# Patient Record
Sex: Male | Born: 1970 | Race: White | Hispanic: No | Marital: Married | Smoking: Never smoker
Health system: Southern US, Community
[De-identification: ages and names within clinical notes are randomized; demographics above are authoritative.]

## PROBLEM LIST (undated history)

## (undated) DIAGNOSIS — E785 Hyperlipidemia, unspecified: Secondary | ICD-10-CM

## (undated) DIAGNOSIS — I10 Essential (primary) hypertension: Secondary | ICD-10-CM

## (undated) DIAGNOSIS — E119 Type 2 diabetes mellitus without complications: Secondary | ICD-10-CM

## (undated) DIAGNOSIS — I2510 Atherosclerotic heart disease of native coronary artery without angina pectoris: Secondary | ICD-10-CM

## (undated) HISTORY — PX: SHOULDER SURGERY: SHX246

## (undated) HISTORY — DX: Essential (primary) hypertension: I10

## (undated) HISTORY — DX: Type 2 diabetes mellitus without complications: E11.9

## (undated) HISTORY — DX: Atherosclerotic heart disease of native coronary artery without angina pectoris: I25.10

## (undated) HISTORY — DX: Hyperlipidemia, unspecified: E78.5

---

## 2016-08-01 DIAGNOSIS — K080 Exfoliation of teeth due to systemic causes: Secondary | ICD-10-CM | POA: Diagnosis not present

## 2016-08-09 DIAGNOSIS — L570 Actinic keratosis: Secondary | ICD-10-CM | POA: Diagnosis not present

## 2016-08-09 DIAGNOSIS — D239 Other benign neoplasm of skin, unspecified: Secondary | ICD-10-CM | POA: Diagnosis not present

## 2017-01-22 DIAGNOSIS — I10 Essential (primary) hypertension: Secondary | ICD-10-CM | POA: Diagnosis not present

## 2017-01-22 DIAGNOSIS — Z0000 Encounter for general adult medical examination without abnormal findings: Secondary | ICD-10-CM | POA: Diagnosis not present

## 2017-01-23 DIAGNOSIS — I10 Essential (primary) hypertension: Secondary | ICD-10-CM | POA: Diagnosis not present

## 2017-01-23 DIAGNOSIS — Z0000 Encounter for general adult medical examination without abnormal findings: Secondary | ICD-10-CM | POA: Diagnosis not present

## 2017-02-05 DIAGNOSIS — I10 Essential (primary) hypertension: Secondary | ICD-10-CM | POA: Diagnosis not present

## 2017-03-19 DIAGNOSIS — I10 Essential (primary) hypertension: Secondary | ICD-10-CM | POA: Diagnosis not present

## 2017-03-19 DIAGNOSIS — E669 Obesity, unspecified: Secondary | ICD-10-CM | POA: Diagnosis not present

## 2017-04-22 DIAGNOSIS — M25511 Pain in right shoulder: Secondary | ICD-10-CM | POA: Diagnosis not present

## 2017-04-22 DIAGNOSIS — S4991XA Unspecified injury of right shoulder and upper arm, initial encounter: Secondary | ICD-10-CM | POA: Diagnosis not present

## 2017-04-24 DIAGNOSIS — M67911 Unspecified disorder of synovium and tendon, right shoulder: Secondary | ICD-10-CM | POA: Diagnosis not present

## 2017-04-30 DIAGNOSIS — M25511 Pain in right shoulder: Secondary | ICD-10-CM | POA: Diagnosis not present

## 2017-05-08 DIAGNOSIS — M75121 Complete rotator cuff tear or rupture of right shoulder, not specified as traumatic: Secondary | ICD-10-CM | POA: Diagnosis not present

## 2017-05-14 DIAGNOSIS — G8918 Other acute postprocedural pain: Secondary | ICD-10-CM | POA: Diagnosis not present

## 2017-05-14 DIAGNOSIS — M75101 Unspecified rotator cuff tear or rupture of right shoulder, not specified as traumatic: Secondary | ICD-10-CM | POA: Diagnosis not present

## 2017-05-14 DIAGNOSIS — M7541 Impingement syndrome of right shoulder: Secondary | ICD-10-CM | POA: Diagnosis not present

## 2017-05-14 DIAGNOSIS — M75121 Complete rotator cuff tear or rupture of right shoulder, not specified as traumatic: Secondary | ICD-10-CM | POA: Diagnosis not present

## 2017-05-14 DIAGNOSIS — S46011D Strain of muscle(s) and tendon(s) of the rotator cuff of right shoulder, subsequent encounter: Secondary | ICD-10-CM | POA: Diagnosis not present

## 2017-05-14 DIAGNOSIS — M24111 Other articular cartilage disorders, right shoulder: Secondary | ICD-10-CM | POA: Diagnosis not present

## 2017-05-14 DIAGNOSIS — R600 Localized edema: Secondary | ICD-10-CM | POA: Diagnosis not present

## 2017-05-14 DIAGNOSIS — M25511 Pain in right shoulder: Secondary | ICD-10-CM | POA: Diagnosis not present

## 2017-05-14 DIAGNOSIS — M75111 Incomplete rotator cuff tear or rupture of right shoulder, not specified as traumatic: Secondary | ICD-10-CM | POA: Diagnosis not present

## 2017-05-25 DIAGNOSIS — M75121 Complete rotator cuff tear or rupture of right shoulder, not specified as traumatic: Secondary | ICD-10-CM | POA: Diagnosis not present

## 2017-05-25 DIAGNOSIS — M25511 Pain in right shoulder: Secondary | ICD-10-CM | POA: Diagnosis not present

## 2017-05-25 DIAGNOSIS — R600 Localized edema: Secondary | ICD-10-CM | POA: Diagnosis not present

## 2017-06-26 DIAGNOSIS — Z9889 Other specified postprocedural states: Secondary | ICD-10-CM | POA: Diagnosis not present

## 2017-06-28 DIAGNOSIS — M25511 Pain in right shoulder: Secondary | ICD-10-CM | POA: Diagnosis not present

## 2017-06-28 DIAGNOSIS — M25611 Stiffness of right shoulder, not elsewhere classified: Secondary | ICD-10-CM | POA: Diagnosis not present

## 2017-07-03 DIAGNOSIS — M25611 Stiffness of right shoulder, not elsewhere classified: Secondary | ICD-10-CM | POA: Diagnosis not present

## 2017-07-03 DIAGNOSIS — M25511 Pain in right shoulder: Secondary | ICD-10-CM | POA: Diagnosis not present

## 2017-07-08 DIAGNOSIS — M25611 Stiffness of right shoulder, not elsewhere classified: Secondary | ICD-10-CM | POA: Diagnosis not present

## 2017-07-08 DIAGNOSIS — M25511 Pain in right shoulder: Secondary | ICD-10-CM | POA: Diagnosis not present

## 2017-07-15 DIAGNOSIS — M25511 Pain in right shoulder: Secondary | ICD-10-CM | POA: Diagnosis not present

## 2017-07-15 DIAGNOSIS — M25611 Stiffness of right shoulder, not elsewhere classified: Secondary | ICD-10-CM | POA: Diagnosis not present

## 2017-07-22 DIAGNOSIS — M25511 Pain in right shoulder: Secondary | ICD-10-CM | POA: Diagnosis not present

## 2017-07-22 DIAGNOSIS — M25611 Stiffness of right shoulder, not elsewhere classified: Secondary | ICD-10-CM | POA: Diagnosis not present

## 2017-08-05 DIAGNOSIS — M25611 Stiffness of right shoulder, not elsewhere classified: Secondary | ICD-10-CM | POA: Diagnosis not present

## 2017-08-05 DIAGNOSIS — M25511 Pain in right shoulder: Secondary | ICD-10-CM | POA: Diagnosis not present

## 2017-08-07 DIAGNOSIS — Z9889 Other specified postprocedural states: Secondary | ICD-10-CM | POA: Diagnosis not present

## 2017-08-15 DIAGNOSIS — M25511 Pain in right shoulder: Secondary | ICD-10-CM | POA: Diagnosis not present

## 2017-08-15 DIAGNOSIS — M25611 Stiffness of right shoulder, not elsewhere classified: Secondary | ICD-10-CM | POA: Diagnosis not present

## 2017-08-21 DIAGNOSIS — M25511 Pain in right shoulder: Secondary | ICD-10-CM | POA: Diagnosis not present

## 2017-08-21 DIAGNOSIS — M25611 Stiffness of right shoulder, not elsewhere classified: Secondary | ICD-10-CM | POA: Diagnosis not present

## 2017-08-29 DIAGNOSIS — M25511 Pain in right shoulder: Secondary | ICD-10-CM | POA: Diagnosis not present

## 2017-08-29 DIAGNOSIS — M25611 Stiffness of right shoulder, not elsewhere classified: Secondary | ICD-10-CM | POA: Diagnosis not present

## 2017-09-06 DIAGNOSIS — M25511 Pain in right shoulder: Secondary | ICD-10-CM | POA: Diagnosis not present

## 2017-09-06 DIAGNOSIS — M25611 Stiffness of right shoulder, not elsewhere classified: Secondary | ICD-10-CM | POA: Diagnosis not present

## 2017-09-11 DIAGNOSIS — Z09 Encounter for follow-up examination after completed treatment for conditions other than malignant neoplasm: Secondary | ICD-10-CM | POA: Diagnosis not present

## 2017-09-11 DIAGNOSIS — M25511 Pain in right shoulder: Secondary | ICD-10-CM | POA: Diagnosis not present

## 2017-10-29 DIAGNOSIS — N529 Male erectile dysfunction, unspecified: Secondary | ICD-10-CM | POA: Diagnosis not present

## 2017-10-29 DIAGNOSIS — I10 Essential (primary) hypertension: Secondary | ICD-10-CM | POA: Diagnosis not present

## 2018-01-01 DIAGNOSIS — K080 Exfoliation of teeth due to systemic causes: Secondary | ICD-10-CM | POA: Diagnosis not present

## 2018-04-24 DIAGNOSIS — E669 Obesity, unspecified: Secondary | ICD-10-CM | POA: Diagnosis not present

## 2018-04-24 DIAGNOSIS — R7301 Impaired fasting glucose: Secondary | ICD-10-CM | POA: Diagnosis not present

## 2018-04-24 DIAGNOSIS — I10 Essential (primary) hypertension: Secondary | ICD-10-CM | POA: Diagnosis not present

## 2018-04-24 DIAGNOSIS — R739 Hyperglycemia, unspecified: Secondary | ICD-10-CM | POA: Diagnosis not present

## 2018-04-29 DIAGNOSIS — E119 Type 2 diabetes mellitus without complications: Secondary | ICD-10-CM | POA: Diagnosis not present

## 2018-04-29 DIAGNOSIS — Z0000 Encounter for general adult medical examination without abnormal findings: Secondary | ICD-10-CM | POA: Diagnosis not present

## 2018-04-29 DIAGNOSIS — I10 Essential (primary) hypertension: Secondary | ICD-10-CM | POA: Diagnosis not present

## 2018-04-29 DIAGNOSIS — N529 Male erectile dysfunction, unspecified: Secondary | ICD-10-CM | POA: Diagnosis not present

## 2018-07-03 DIAGNOSIS — K080 Exfoliation of teeth due to systemic causes: Secondary | ICD-10-CM | POA: Diagnosis not present

## 2018-07-23 DIAGNOSIS — I10 Essential (primary) hypertension: Secondary | ICD-10-CM | POA: Diagnosis not present

## 2018-07-23 DIAGNOSIS — E119 Type 2 diabetes mellitus without complications: Secondary | ICD-10-CM | POA: Diagnosis not present

## 2018-07-30 DIAGNOSIS — E669 Obesity, unspecified: Secondary | ICD-10-CM | POA: Diagnosis not present

## 2018-07-30 DIAGNOSIS — I10 Essential (primary) hypertension: Secondary | ICD-10-CM | POA: Diagnosis not present

## 2018-07-30 DIAGNOSIS — E119 Type 2 diabetes mellitus without complications: Secondary | ICD-10-CM | POA: Diagnosis not present

## 2019-02-03 DIAGNOSIS — I10 Essential (primary) hypertension: Secondary | ICD-10-CM | POA: Diagnosis not present

## 2019-02-03 DIAGNOSIS — E119 Type 2 diabetes mellitus without complications: Secondary | ICD-10-CM | POA: Diagnosis not present

## 2019-02-03 DIAGNOSIS — Z6836 Body mass index (BMI) 36.0-36.9, adult: Secondary | ICD-10-CM | POA: Diagnosis not present

## 2019-04-29 DIAGNOSIS — I10 Essential (primary) hypertension: Secondary | ICD-10-CM | POA: Diagnosis not present

## 2019-04-29 DIAGNOSIS — E119 Type 2 diabetes mellitus without complications: Secondary | ICD-10-CM | POA: Diagnosis not present

## 2019-05-06 DIAGNOSIS — Z0000 Encounter for general adult medical examination without abnormal findings: Secondary | ICD-10-CM | POA: Diagnosis not present

## 2019-05-06 DIAGNOSIS — I10 Essential (primary) hypertension: Secondary | ICD-10-CM | POA: Diagnosis not present

## 2019-05-06 DIAGNOSIS — E119 Type 2 diabetes mellitus without complications: Secondary | ICD-10-CM | POA: Diagnosis not present

## 2019-08-04 DIAGNOSIS — E119 Type 2 diabetes mellitus without complications: Secondary | ICD-10-CM | POA: Diagnosis not present

## 2019-08-24 DIAGNOSIS — K7689 Other specified diseases of liver: Secondary | ICD-10-CM | POA: Diagnosis not present

## 2019-09-16 ENCOUNTER — Encounter: Payer: Self-pay | Admitting: *Deleted

## 2020-04-26 DIAGNOSIS — Z23 Encounter for immunization: Secondary | ICD-10-CM | POA: Diagnosis not present

## 2020-05-04 DIAGNOSIS — I10 Essential (primary) hypertension: Secondary | ICD-10-CM | POA: Diagnosis not present

## 2020-05-04 DIAGNOSIS — Z0000 Encounter for general adult medical examination without abnormal findings: Secondary | ICD-10-CM | POA: Diagnosis not present

## 2020-05-04 DIAGNOSIS — E119 Type 2 diabetes mellitus without complications: Secondary | ICD-10-CM | POA: Diagnosis not present

## 2020-05-04 DIAGNOSIS — Z13220 Encounter for screening for lipoid disorders: Secondary | ICD-10-CM | POA: Diagnosis not present

## 2020-05-11 DIAGNOSIS — Z0000 Encounter for general adult medical examination without abnormal findings: Secondary | ICD-10-CM | POA: Diagnosis not present

## 2020-05-11 DIAGNOSIS — I10 Essential (primary) hypertension: Secondary | ICD-10-CM | POA: Diagnosis not present

## 2020-08-31 DIAGNOSIS — Z20828 Contact with and (suspected) exposure to other viral communicable diseases: Secondary | ICD-10-CM | POA: Diagnosis not present

## 2020-09-07 DIAGNOSIS — E119 Type 2 diabetes mellitus without complications: Secondary | ICD-10-CM | POA: Diagnosis not present

## 2020-09-07 DIAGNOSIS — M79671 Pain in right foot: Secondary | ICD-10-CM | POA: Diagnosis not present

## 2020-09-07 DIAGNOSIS — I10 Essential (primary) hypertension: Secondary | ICD-10-CM | POA: Diagnosis not present

## 2020-09-07 DIAGNOSIS — Z0000 Encounter for general adult medical examination without abnormal findings: Secondary | ICD-10-CM | POA: Diagnosis not present

## 2020-09-12 DIAGNOSIS — E119 Type 2 diabetes mellitus without complications: Secondary | ICD-10-CM | POA: Diagnosis not present

## 2020-09-12 DIAGNOSIS — I10 Essential (primary) hypertension: Secondary | ICD-10-CM | POA: Diagnosis not present

## 2021-02-09 LAB — COLOGUARD: COLOGUARD: NEGATIVE

## 2021-05-24 DIAGNOSIS — Z8249 Family history of ischemic heart disease and other diseases of the circulatory system: Secondary | ICD-10-CM | POA: Diagnosis not present

## 2021-05-24 DIAGNOSIS — Z0000 Encounter for general adult medical examination without abnormal findings: Secondary | ICD-10-CM | POA: Diagnosis not present

## 2021-05-24 DIAGNOSIS — E119 Type 2 diabetes mellitus without complications: Secondary | ICD-10-CM | POA: Diagnosis not present

## 2021-05-24 DIAGNOSIS — I10 Essential (primary) hypertension: Secondary | ICD-10-CM | POA: Diagnosis not present

## 2021-06-01 ENCOUNTER — Other Ambulatory Visit: Payer: Self-pay | Admitting: Internal Medicine

## 2021-06-01 DIAGNOSIS — I10 Essential (primary) hypertension: Secondary | ICD-10-CM

## 2021-06-01 DIAGNOSIS — Z8249 Family history of ischemic heart disease and other diseases of the circulatory system: Secondary | ICD-10-CM

## 2021-06-30 ENCOUNTER — Ambulatory Visit
Admission: RE | Admit: 2021-06-30 | Discharge: 2021-06-30 | Disposition: A | Discharge status: Home / Self Care | Payer: No Typology Code available for payment source | Source: Ambulatory Visit | Attending: Internal Medicine | Admitting: Internal Medicine

## 2021-06-30 DIAGNOSIS — I10 Essential (primary) hypertension: Secondary | ICD-10-CM

## 2021-06-30 DIAGNOSIS — Z8249 Family history of ischemic heart disease and other diseases of the circulatory system: Secondary | ICD-10-CM

## 2021-07-20 NOTE — Progress Notes (Signed)
Date:  07/21/2021   ID:  Earl Earl [name], DOB 08-Jun-1971, MRN [id number]  PCP:  Merri Brunette, MD  Cardiologist:  Tessa Lerner, DO, [facility]  (established care 07/21/2021)  REASON FOR CONSULT: Atherosclerotic heart disease of native coronary artery without angina pectoris  REQUESTING PHYSICIAN:  Merri Brunette, MD [address] [place],  [place] [address]  Chief Complaint  Earl [name] presents with   New Earl [name] (Initial Visit)    Calcium score.     HPI  Earl Earl [name] is a 50 y.o. male who presents to the office with a chief complaint of " evaluation of moderate coronary artery calcification." Earl [name]'s past medical history and cardiovascular risk factors include: Moderate coronary artery calcification, family history of premature CAD, non-insulin-dependent diabetes mellitus type 2, hypertension, erectile dysfunction.   He is referred to the office at the request of Merri Brunette, MD for evaluation of atherosclerotic heart disease of native coronary artery without angina pectoris.  Earl [name] recently had a coronary artery calcium score with his primary care provider given his multiple cardiovascular risk factors and was noted to have moderate coronary artery calcium with a total score of 222 AU.  Clinically he is asymptomatic and denies any chest pain or shortness of breath at rest or with effort related activities.  No decrease in overall functional status.  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  Earl [name] is a known diabetic; however, currently not on statin therapy.  Earl [name]'s father had a myocardial infarction before the age of 64.  He is currently alive.  FUNCTIONAL STATUS: Ambulates 5 to 6 miles a day.  ALLERGIES: No Known Allergies  MEDICATION LIST PRIOR TO VISIT: Current Meds  Medication Sig   amLODipine (NORVASC) 10 MG tablet Take 10 mg by mouth daily.   aspirin EC 81 MG tablet Take 1 tablet (81 mg total) by mouth daily. Swallow  whole.   atorvastatin (LIPITOR) 20 MG tablet Take 1 tablet (20 mg total) by mouth at bedtime.   Cholecalciferol (D3-1000 PO) Take 1 capsule by mouth daily.   metFORMIN (GLUCOPHAGE-XR) 500 MG 24 hr tablet Take 1 tablet by mouth daily.   sildenafil (VIAGRA) 100 MG tablet 1 tablet as needed   [DISCONTINUED] tadalafil (CIALIS) 20 MG tablet 1 tablet     PAST MEDICAL HISTORY: Past Medical History:  Diagnosis Date   Coronary atherosclerosis due to calcified coronary lesion    Diabetes mellitus without complication (HCC)    Hyperlipidemia    Hypertension     PAST SURGICAL HISTORY: Past Surgical History:  Procedure Laterality Date   SHOULDER SURGERY Right     FAMILY HISTORY: The Earl [name] family history includes Diabetes in his father and mother; Heart attack (age of onset: 71) in his father; Hypertension in his father.  SOCIAL HISTORY:  The Earl [name]  reports that he has never smoked. He has never used smokeless tobacco.  REVIEW OF SYSTEMS: Review of Systems  Constitutional: Negative for chills and fever.  HENT:  Negative for hoarse voice and nosebleeds.   Eyes:  Negative for discharge, double vision and pain.  Cardiovascular:  Negative for chest pain, claudication, dyspnea on exertion, leg swelling, near-syncope, orthopnea, palpitations, paroxysmal nocturnal dyspnea and syncope.  Respiratory:  Negative for hemoptysis and shortness of breath.   Musculoskeletal:  Negative for muscle cramps and myalgias.  Gastrointestinal:  Negative for abdominal pain, constipation, diarrhea, hematemesis, hematochezia, melena, nausea and vomiting.  Neurological:  Negative for dizziness and  light-headedness.   PHYSICAL EXAM: Vitals with BMI 07/21/2021  Height 5\' 10"   Weight 232 lbs 6 oz  BMI 33.35  Systolic 132  Diastolic 83  Pulse 80    CONSTITUTIONAL: Well-developed and well-nourished. No acute distress.  SKIN: Skin is warm and dry. No rash noted. No cyanosis. No pallor. No jaundice HEAD:  Normocephalic and atraumatic.  EYES: No scleral icterus MOUTH/THROAT: Moist oral membranes.  NECK: No JVD present. No thyromegaly noted. No carotid bruits  LYMPHATIC: No visible cervical adenopathy.  CHEST Normal respiratory effort. No intercostal retractions  LUNGS: Clear to auscultation bilaterally. No stridor. No wheezes. No rales.  CARDIOVASCULAR: Regular rate and rhythm, positive S1-S2, no murmurs rubs or gallops appreciated. ABDOMINAL: Obese, soft, nontender, nondistended, positive bowel sounds all 4 quadrants. No apparent ascites.  EXTREMITIES: No peripheral edema, +2 DP and PT pulse bilateral.  HEMATOLOGIC: No significant bruising NEUROLOGIC: Oriented to person, place, and time. Nonfocal. Normal muscle tone.  PSYCHIATRIC: Normal mood and affect. Normal behavior. Cooperative  CARDIAC DATABASE: EKG: 07/21/2021: Normal sinus rhythm, 63 bpm, without underlying ischemia or injury pattern, rare PACs.   Echocardiogram: No results found for this or any previous visit from the past 1095 days.  Stress Testing: No results found for this or any previous visit from the past 1095 days.  Heart Catheterization: None  Calcium scoring  06/30/2021: LM: 0 LAD: 182 LCx: 0.3 RCA: 39.7 Coronary calcium score of 222 is at the 93rd percentile for the Earl [name]'s age, sex and race.  LABORATORY DATA: External Labs: Collected: 05/10/2021 available in Care Everywhere. Hemoglobin A1c 7.7. Total cholesterol 162, triglycerides 275, HDL 28, non-HDL 134, LDL calculated 79 Hemoglobin 15.7 g/dL, hematocrit 07/10/2021 Sodium 140, potassium 4, chloride 101, bicarb 28, BUN 22, creatinine 1.41 GFR 57 AST 41, ALT 77, alkaline phosphatase 62 (all within normal limits)  IMPRESSION:    ICD-10-CM   1. Coronary atherosclerosis due to calcified coronary lesion  I25.10 EKG 12-Lead   I25.84 PCV ECHOCARDIOGRAM COMPLETE    PCV MYOCARDIAL PERFUSION WO LEXISCAN    aspirin EC 81 MG tablet    atorvastatin (LIPITOR) 20 MG  tablet    2. Type 2 diabetes mellitus with hyperglycemia, without long-term current use of insulin (HCC)  E11.65     3. Benign hypertension  I10     4. Erectile dysfunction, unspecified erectile dysfunction type  N52.9     5. Class 1 obesity due to excess calories with serious comorbidity and body mass index (BMI) of 33.0 to 33.9 in adult  E66.09    Z68.33        RECOMMENDATIONS: Earl Earl [name] is a 50 y.o. male whose past medical history and cardiac risk factors include: Moderate coronary artery calcification, family history of premature CAD, non-insulin-dependent diabetes mellitus type 2, hypertension, erectile dysfunction.   Moderate coronary artery calcification: CAC 222 AU placing the Earl [name] at the 93rd percentile. Aspirin 81 mg p.o. daily Atorvastatin 20 mg p.o. nightly.  Medication profile discussed. Echocardiogram will be ordered to evaluate for structural heart disease and left ventricular systolic function. Plan exercise nuclear stress test. Educated on the importance of secondary prevention and improving his modifiable cardiovascular risk factors.  Non-insulin-dependent diabetes mellitus type 2: Educated on importance of glycemic control. Currently managed by primary care provider.  Benign essential hypertension: Office blood pressures within acceptable range. Low-salt diet recommended. Continue current medications.  As part of this consultation reviewed records such as office notes provided by primary team, coronary calcium report, outside labs available  in Care Everywhere, discussing disease management, Earl [name] education, and coordination of care.  FINAL MEDICATION LIST END OF ENCOUNTER: Meds ordered this encounter  Medications   aspirin EC 81 MG tablet    Sig: Take 1 tablet (81 mg total) by mouth daily. Swallow whole.    Dispense:  30 tablet    Refill:  11   atorvastatin (LIPITOR) 20 MG tablet    Sig: Take 1 tablet (20 mg total) by mouth at bedtime.     Dispense:  30 tablet    Refill:  0     Medications Discontinued During This Encounter  Medication Reason   tadalafil (CIALIS) 20 MG tablet Earl [name] Preference     Current Outpatient Medications:    amLODipine (NORVASC) 10 MG tablet, Take 10 mg by mouth daily., Disp: , Rfl:    aspirin EC 81 MG tablet, Take 1 tablet (81 mg total) by mouth daily. Swallow whole., Disp: 30 tablet, Rfl: 11   atorvastatin (LIPITOR) 20 MG tablet, Take 1 tablet (20 mg total) by mouth at bedtime., Disp: 30 tablet, Rfl: 0   Cholecalciferol (D3-1000 PO), Take 1 capsule by mouth daily., Disp: , Rfl:    metFORMIN (GLUCOPHAGE-XR) 500 MG 24 hr tablet, Take 1 tablet by mouth daily., Disp: , Rfl:    sildenafil (VIAGRA) 100 MG tablet, 1 tablet as needed, Disp: , Rfl:   Orders Placed This Encounter  Procedures   PCV MYOCARDIAL PERFUSION WO LEXISCAN   EKG 12-Lead   PCV ECHOCARDIOGRAM COMPLETE    There are no Earl [name] Instructions on file for this visit.   --Continue cardiac medications as reconciled in final medication list. --Return in about 4 weeks (around 08/18/2021) for Follow up, Coronary artery calcification, Review test results. Or sooner if needed. --Continue follow-up with your primary care physician regarding the management of your other chronic comorbid conditions.  Earl [name]'s questions and concerns were addressed to his satisfaction. He voices understanding of the instructions provided during this encounter.   This note was created using a voice recognition software as a result there may be grammatical errors inadvertently enclosed that do not reflect the nature of this encounter. Every attempt is made to correct such errors.  Tessa Lerner, [place], [facility]  Pager: [phone] Office: [phone]

## 2021-07-21 ENCOUNTER — Ambulatory Visit: Payer: Federal, State, Local not specified - PPO | Admitting: Cardiology

## 2021-07-21 ENCOUNTER — Other Ambulatory Visit: Payer: Self-pay

## 2021-07-21 ENCOUNTER — Encounter: Payer: Self-pay | Admitting: Cardiology

## 2021-07-21 VITALS — BP 132/83 | HR 80 | Temp 97.9°F | Ht 70.0 in | Wt 232.4 lb

## 2021-07-21 DIAGNOSIS — N529 Male erectile dysfunction, unspecified: Secondary | ICD-10-CM

## 2021-07-21 DIAGNOSIS — I10 Essential (primary) hypertension: Secondary | ICD-10-CM

## 2021-07-21 DIAGNOSIS — Z6833 Body mass index (BMI) 33.0-33.9, adult: Secondary | ICD-10-CM

## 2021-07-21 DIAGNOSIS — I2584 Coronary atherosclerosis due to calcified coronary lesion: Secondary | ICD-10-CM | POA: Diagnosis not present

## 2021-07-21 DIAGNOSIS — I2510 Atherosclerotic heart disease of native coronary artery without angina pectoris: Secondary | ICD-10-CM

## 2021-07-21 DIAGNOSIS — E1165 Type 2 diabetes mellitus with hyperglycemia: Secondary | ICD-10-CM

## 2021-07-21 DIAGNOSIS — E6609 Other obesity due to excess calories: Secondary | ICD-10-CM

## 2021-07-21 MED ORDER — ATORVASTATIN CALCIUM 20 MG PO TABS: 20.0000 mg | ORAL_TABLET | Freq: Every day | ORAL | 0 refills | Status: DC

## 2021-07-21 MED ORDER — ASPIRIN EC 81 MG PO TBEC: 81.0000 mg | DELAYED_RELEASE_TABLET | Freq: Every day | ORAL | 11 refills | Status: AC

## 2021-08-07 ENCOUNTER — Other Ambulatory Visit: Payer: Self-pay

## 2021-08-07 ENCOUNTER — Ambulatory Visit: Payer: Federal, State, Local not specified - PPO

## 2021-08-07 DIAGNOSIS — I2510 Atherosclerotic heart disease of native coronary artery without angina pectoris: Secondary | ICD-10-CM

## 2021-08-07 DIAGNOSIS — I2584 Coronary atherosclerosis due to calcified coronary lesion: Secondary | ICD-10-CM

## 2021-08-21 ENCOUNTER — Encounter: Payer: Self-pay | Admitting: Cardiology

## 2021-08-21 ENCOUNTER — Ambulatory Visit: Payer: Federal, State, Local not specified - PPO | Admitting: Cardiology

## 2021-08-21 ENCOUNTER — Other Ambulatory Visit: Payer: Self-pay

## 2021-08-21 VITALS — BP 124/84 | HR 82 | Temp 98.2°F | Ht 69.0 in | Wt 231.0 lb

## 2021-08-21 DIAGNOSIS — I10 Essential (primary) hypertension: Secondary | ICD-10-CM | POA: Diagnosis not present

## 2021-08-21 DIAGNOSIS — I2510 Atherosclerotic heart disease of native coronary artery without angina pectoris: Secondary | ICD-10-CM

## 2021-08-21 DIAGNOSIS — E6609 Other obesity due to excess calories: Secondary | ICD-10-CM

## 2021-08-21 DIAGNOSIS — E1165 Type 2 diabetes mellitus with hyperglycemia: Secondary | ICD-10-CM | POA: Diagnosis not present

## 2021-08-21 DIAGNOSIS — N529 Male erectile dysfunction, unspecified: Secondary | ICD-10-CM

## 2021-08-21 NOTE — Progress Notes (Signed)
Date:  08/21/2021   ID:  Earl [name], DOB 1971/04/11, MRN [id number]  PCP:  Merri Brunette, MD  Cardiologist:  Tessa Lerner, DO, [facility]  (established care 07/21/2021)  Date: 08/21/21 Last Office Visit: 07/21/2021  Chief Complaint  [name] presents with   Follow-up    Coronary artery calcification work-up   Results   HPI  Earl [name] is a 50 y.o. male who presents to the office with a chief complaint of " 1 month follow-up for coronary calcification work-up." [name]'s past medical history and cardiovascular risk factors include: Moderate coronary artery calcification, family history of premature CAD, non-insulin-dependent diabetes mellitus type 2, hypertension, erectile dysfunction.   He is referred to the office at the request of Merri Brunette, MD for evaluation of atherosclerotic heart disease of native coronary artery without angina pectoris.  Referred to the office for further evaluation of coronary artery disease given his moderate coronary artery calcification.  [name] recently had a calcium scoring performed with his PCP and was noted to have a total score of 222 AU.  Overall asymptomatic with regards to chest pain or shortness of breath.  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  Given his CAC, diabetes, hypertension, and hyperlipidemia that shared decision was to proceed with ischemic evaluation at the last visit.  [name] underwent an echocardiogram and stress test results reviewed with him in great detail and noted below for further reference.  Clinically he is asymptomatic.  No hospitalizations or urgent care visits for cardiovascular symptoms since last office encounter. [name]'s father had a myocardial infarction before the age of 38.  He is currently alive.  FUNCTIONAL STATUS: Ambulates 5 to 6 miles a day.  ALLERGIES: No Known Allergies  MEDICATION LIST PRIOR TO VISIT: No outpatient medications have been marked as taking for the  08/21/21 encounter (Office Visit) with Odis Hollingshead, Rakiya Krawczyk, DO.     PAST MEDICAL HISTORY: Past Medical History:  Diagnosis Date   Coronary atherosclerosis due to calcified coronary lesion    Diabetes mellitus without complication (HCC)    Hyperlipidemia    Hypertension     PAST SURGICAL HISTORY: Past Surgical History:  Procedure Laterality Date   SHOULDER SURGERY Right     FAMILY HISTORY: [name] family history includes Diabetes in his father and mother; Heart attack (age of onset: 55) in his father; Hypertension in his father.  SOCIAL HISTORY:  [name]  reports that he has never smoked. He has never used smokeless tobacco.  REVIEW OF SYSTEMS: Review of Systems  Constitutional: Negative for chills and fever.  HENT:  Negative for hoarse voice and nosebleeds.   Eyes:  Negative for discharge, double vision and pain.  Cardiovascular:  Negative for chest pain, claudication, dyspnea on exertion, leg swelling, near-syncope, orthopnea, palpitations, paroxysmal nocturnal dyspnea and syncope.  Respiratory:  Negative for hemoptysis and shortness of breath.   Musculoskeletal:  Negative for muscle cramps and myalgias.  Gastrointestinal:  Negative for abdominal pain, constipation, diarrhea, hematemesis, hematochezia, melena, nausea and vomiting.  Neurological:  Negative for dizziness and light-headedness.   PHYSICAL EXAM: Vitals with BMI 08/21/2021 07/21/2021  Height 5\' 9"  5\' 10"   Weight 231 lbs 232 lbs 6 oz  BMI 34.1 33.35  Systolic 124 132  Diastolic 84 83  Pulse 82 80    CONSTITUTIONAL: Well-developed and well-nourished. No acute distress.  SKIN: Skin is warm and dry. No rash noted. No cyanosis. No pallor. No jaundice HEAD: Normocephalic and  atraumatic.  EYES: No scleral icterus MOUTH/THROAT: Moist oral membranes.  NECK: No JVD present. No thyromegaly noted. No carotid bruits  LYMPHATIC: No visible cervical adenopathy.  CHEST Normal respiratory effort. No intercostal  retractions  LUNGS: Clear to auscultation bilaterally. No stridor. No wheezes. No rales.  CARDIOVASCULAR: Regular rate and rhythm, positive S1-S2, no murmurs rubs or gallops appreciated. ABDOMINAL: Obese, soft, nontender, nondistended, positive bowel sounds all 4 quadrants. No apparent ascites.  EXTREMITIES: No peripheral edema, +2 DP and PT pulse bilateral.  HEMATOLOGIC: No significant bruising NEUROLOGIC: Oriented to person, place, and time. Nonfocal. Normal muscle tone.  PSYCHIATRIC: Normal mood and affect. Normal behavior. Cooperative  CARDIAC DATABASE: EKG: 07/21/2021: Normal sinus rhythm, 63 bpm, without underlying ischemia or injury pattern, rare PACs.   Echocardiogram: 08/07/2021:  Normal LV systolic function with EF 62%. Left ventricle cavity is normal in size. Moderate concentric remodeling of the left ventricle. Normal global wall motion. Normal diastolic filling pattern, normal LAP. Calculated EF 62%. Trileaflet aortic valve. No evidence of aortic stenosis. Trace aortic regurgitation.  Stress Testing: Exercise Tetrofosmin stress test 08/07/2021: Exercise nuclear stress test was performed using Bruce protocol. [name] reached 8.3 METS, and 92% of age predicted maximum heart rate. Exercise capacity was fair. No chest pain reported. Heart rate and hemodynamic response were normal. Stress EKG revealed no ischemic changes. Normal myocardial perfusion. Stress LVEF 53%. Low risk study.  Heart Catheterization: None  Calcium scoring  06/30/2021: LM: 0 LAD: 182 LCx: 0.3 RCA: 39.7 Coronary calcium score of 222 is at the 93rd percentile for [name]'s age, sex and race.  LABORATORY DATA: External Labs: Collected: 05/10/2021 available in Care Everywhere. Hemoglobin A1c 7.7. Total cholesterol 162, triglycerides 275, HDL 28, non-HDL 134, LDL calculated 79 Hemoglobin 15.7 g/dL, hematocrit 49.7% Sodium 140, potassium 4, chloride 101, bicarb 28, BUN 22, creatinine 1.41 GFR 57 AST  41, ALT 77, alkaline phosphatase 62 (all within normal limits)  IMPRESSION:    ICD-10-CM   1. Coronary atherosclerosis due to calcified coronary lesion  I25.10    I25.84     2. Type 2 diabetes mellitus with hyperglycemia, without long-term current use of insulin (HCC)  E11.65     3. Benign hypertension  I10     4. Erectile dysfunction, unspecified erectile dysfunction type  N52.9     5. Class 1 obesity due to excess calories with serious comorbidity and body mass index (BMI) of 33.0 to 33.9 in adult  E66.09    Z68.33        RECOMMENDATIONS: Earl [name] is a 50 y.o. male whose past medical history and cardiac risk factors include: Moderate coronary artery calcification, family history of premature CAD, non-insulin-dependent diabetes mellitus type 2, hypertension, erectile dysfunction.   Coronary atherosclerosis due to calcified coronary lesion CAC 222 AU placing [name] in the 93rd percentile. Continue aspirin and statin therapy. Echocardiogram and stress test results reviewed with [name] in great detail. No additional cardiovascular testing warranted at this time. Educated on the importance of secondary prevention with regards to improving his modifiable cardiovascular risk factors.  Type 2 diabetes mellitus with hyperglycemia, without long-term current use of insulin (HCC) Office blood pressures are very well controlled. Recommend transitioning him from Norvasc to either ACE inhibitor's or ARB.  Will defer this to primary team. Continue statin therapy. Educated on importance of glycemic control.  Benign hypertension Office blood pressures are very well controlled. Medications reconciled. Would recommend transitioning him from amlodipine to either ACE inhibitor's or ARB's,  will defer to primary team at this time. Low-salt diet recommended  Class 1 obesity due to excess calories with serious comorbidity and body mass index (BMI) of 33.0 to 33.9 in adult Body mass  index is 34.11 kg/m. I reviewed with [name] the importance of diet, regular physical activity/exercise, weight loss.   [name] is educated on increasing physical activity gradually as tolerated.  With the goal of moderate intensity exercise for 30 minutes a day 5 days a week.  FINAL MEDICATION LIST END OF ENCOUNTER: No orders of the defined types were placed in this encounter.    There are no discontinued medications.    Current Outpatient Medications:    amLODipine (NORVASC) 10 MG tablet, Take 10 mg by mouth daily., Disp: , Rfl:    aspirin EC 81 MG tablet, Take 1 tablet (81 mg total) by mouth daily. Swallow whole., Disp: 30 tablet, Rfl: 11   atorvastatin (LIPITOR) 20 MG tablet, Take 1 tablet (20 mg total) by mouth at bedtime., Disp: 30 tablet, Rfl: 0   Cholecalciferol (D3-1000 PO), Take 1 capsule by mouth daily., Disp: , Rfl:    metFORMIN (GLUCOPHAGE-XR) 500 MG 24 hr tablet, Take 1 tablet by mouth daily., Disp: , Rfl:    sildenafil (VIAGRA) 100 MG tablet, 1 tablet as needed, Disp: , Rfl:   No orders of the defined types were placed in this encounter.   There are no [name] Instructions on file for this visit.   --Continue cardiac medications as reconciled in final medication list. --Return in about 1 year (around 08/21/2022) for Follow up, Coronary artery calcification. Or sooner if needed. --Continue follow-up with your primary care physician regarding the management of your other chronic comorbid conditions.  [name]'s questions and concerns were addressed to his satisfaction. He voices understanding of the instructions provided during this encounter.   This note was created using a voice recognition software as a result there may be grammatical errors inadvertently enclosed that do not reflect the nature of this encounter. Every attempt is made to correct such errors.  Total time spent: 23 minutes.  Tessa Lerner, [place], [facility]  Pager: [phone] Office: [phone]

## 2021-08-23 ENCOUNTER — Other Ambulatory Visit: Payer: Self-pay | Admitting: Cardiology

## 2021-08-23 DIAGNOSIS — I2510 Atherosclerotic heart disease of native coronary artery without angina pectoris: Secondary | ICD-10-CM

## 2021-08-23 DIAGNOSIS — I2584 Coronary atherosclerosis due to calcified coronary lesion: Secondary | ICD-10-CM

## 2021-09-22 ENCOUNTER — Other Ambulatory Visit: Payer: Self-pay | Admitting: Cardiology

## 2021-09-22 DIAGNOSIS — I2510 Atherosclerotic heart disease of native coronary artery without angina pectoris: Secondary | ICD-10-CM

## 2021-09-22 DIAGNOSIS — I2584 Coronary atherosclerosis due to calcified coronary lesion: Secondary | ICD-10-CM

## 2021-09-27 DIAGNOSIS — E7800 Pure hypercholesterolemia, unspecified: Secondary | ICD-10-CM | POA: Diagnosis not present

## 2021-09-27 DIAGNOSIS — I2510 Atherosclerotic heart disease of native coronary artery without angina pectoris: Secondary | ICD-10-CM | POA: Diagnosis not present

## 2021-09-27 DIAGNOSIS — I10 Essential (primary) hypertension: Secondary | ICD-10-CM | POA: Diagnosis not present

## 2021-09-28 ENCOUNTER — Other Ambulatory Visit: Payer: Self-pay

## 2021-10-15 NOTE — Progress Notes (Signed)
External Labs: Collected: 09/20/2021 provided by primary team Sodium 140, potassium 4.1, chloride 103, bicarb 30, BUN 16, creatinine 1.19 mg/dL. Glucose 143. AST 31, ALT 56, alkaline phosphatase 56 GFR >60 mL/L/1.73 m Hemoglobin A1c 6.2 Total cholesterol 118, HDL 37, calculated LDL 59, non-HDL 81, triglycerides 108  Patient was last seen in September 2022 and was recommended to have annual follow-up visit in September 2023.  Please have him schedule this and bring any new labs available to that office visit for review.  Or sooner if change in clinical status

## 2021-10-23 ENCOUNTER — Other Ambulatory Visit: Payer: Self-pay | Admitting: Cardiology

## 2021-10-23 DIAGNOSIS — I2510 Atherosclerotic heart disease of native coronary artery without angina pectoris: Secondary | ICD-10-CM

## 2021-10-25 NOTE — Progress Notes (Signed)
Message from [facility]

## 2021-11-15 NOTE — Progress Notes (Signed)
Attempted to call pt, no answer. Unable to leave VM requesting call back.

## 2021-11-16 NOTE — Progress Notes (Signed)
Attempted to call pt, no answer. Unable to leave vm requesting call back

## 2021-11-17 NOTE — Progress Notes (Signed)
Attempted to call pt, no answer. Unable to leave vm requesting call back

## 2021-11-20 ENCOUNTER — Other Ambulatory Visit: Payer: Self-pay | Admitting: Cardiology

## 2021-11-20 DIAGNOSIS — I2510 Atherosclerotic heart disease of native coronary artery without angina pectoris: Secondary | ICD-10-CM

## 2021-12-21 ENCOUNTER — Other Ambulatory Visit: Payer: Self-pay | Admitting: Cardiology

## 2021-12-21 DIAGNOSIS — I2510 Atherosclerotic heart disease of native coronary artery without angina pectoris: Secondary | ICD-10-CM

## 2022-01-24 ENCOUNTER — Other Ambulatory Visit: Payer: Self-pay | Admitting: Cardiology

## 2022-01-24 DIAGNOSIS — I2510 Atherosclerotic heart disease of native coronary artery without angina pectoris: Secondary | ICD-10-CM

## 2022-04-24 DIAGNOSIS — E7800 Pure hypercholesterolemia, unspecified: Secondary | ICD-10-CM | POA: Diagnosis not present

## 2022-04-24 DIAGNOSIS — I2510 Atherosclerotic heart disease of native coronary artery without angina pectoris: Secondary | ICD-10-CM | POA: Diagnosis not present

## 2022-04-24 DIAGNOSIS — I10 Essential (primary) hypertension: Secondary | ICD-10-CM | POA: Diagnosis not present

## 2022-04-24 DIAGNOSIS — E119 Type 2 diabetes mellitus without complications: Secondary | ICD-10-CM | POA: Diagnosis not present

## 2022-07-19 ENCOUNTER — Other Ambulatory Visit: Payer: Self-pay | Admitting: Cardiology

## 2022-07-19 DIAGNOSIS — I2510 Atherosclerotic heart disease of native coronary artery without angina pectoris: Secondary | ICD-10-CM

## 2022-08-15 DIAGNOSIS — E7800 Pure hypercholesterolemia, unspecified: Secondary | ICD-10-CM | POA: Diagnosis not present

## 2022-08-15 DIAGNOSIS — Z0000 Encounter for general adult medical examination without abnormal findings: Secondary | ICD-10-CM | POA: Diagnosis not present

## 2022-08-15 DIAGNOSIS — I10 Essential (primary) hypertension: Secondary | ICD-10-CM | POA: Diagnosis not present

## 2022-08-15 DIAGNOSIS — Z125 Encounter for screening for malignant neoplasm of prostate: Secondary | ICD-10-CM | POA: Diagnosis not present

## 2022-08-22 DIAGNOSIS — Z0000 Encounter for general adult medical examination without abnormal findings: Secondary | ICD-10-CM | POA: Diagnosis not present

## 2022-08-22 DIAGNOSIS — I10 Essential (primary) hypertension: Secondary | ICD-10-CM | POA: Diagnosis not present

## 2022-08-22 DIAGNOSIS — I2510 Atherosclerotic heart disease of native coronary artery without angina pectoris: Secondary | ICD-10-CM | POA: Diagnosis not present

## 2022-09-08 IMAGING — CT CT CARDIAC CORONARY ARTERY CALCIUM SCORE
3 series · 14 of 20 positions shown, 16 images · non-contrast
Comparison: None.

CLINICAL DATA: 50-year-old Caucasian male with history of
hypertension, diabetes and family history of heart disease.

EXAM:
CT CARDIAC CORONARY ARTERY CALCIUM SCORE
TECHNIQUE: Non-contrast imaging through the heart was performed using
prospective ECG gating. Image post processing was performed on an
independent workstation, allowing for quantitative analysis of the
heart and coronary arteries. Note that this exam targets the heart
and the chest was not imaged in its entirety.

[Series 2: calcium scoring 2.00 qr36 bestdiast 61% hrt calciu · axial · 0.39mm/px · z∈[+1662,+1734]mm · 4 of 60 slices shown]
[im 12/60  vessel]
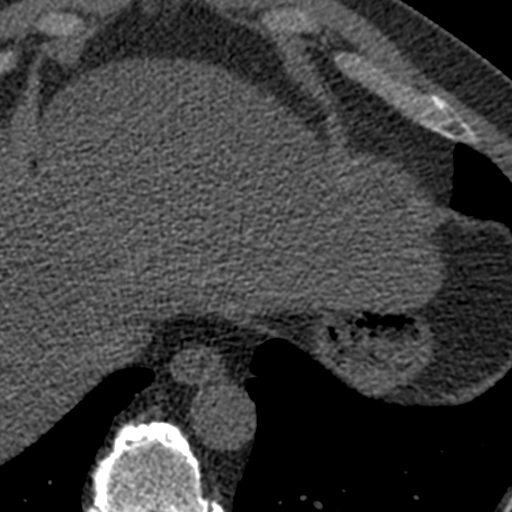
[im 24/60  vessel]
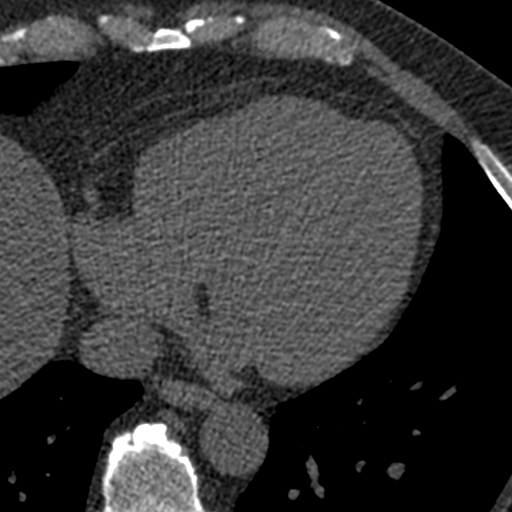
[im 36/60  vessel]
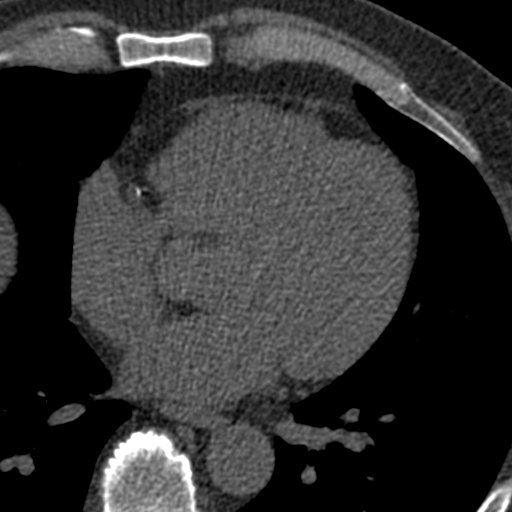
[im 48/60  vessel]
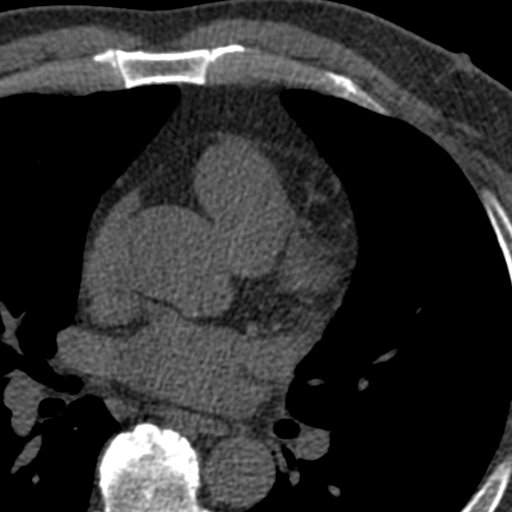

[Series 3: calcium scoring 2.00 br40 bestdiast 61% axial · axial · 0.63mm/px · z∈[+1658,+1738]mm · 5 of 60 slices shown, 7 images]
[im 10/60  vessel]
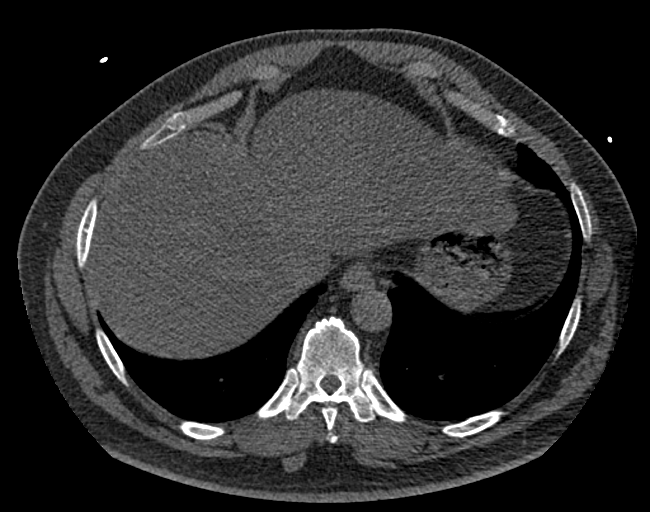
[im 10/60  lung]
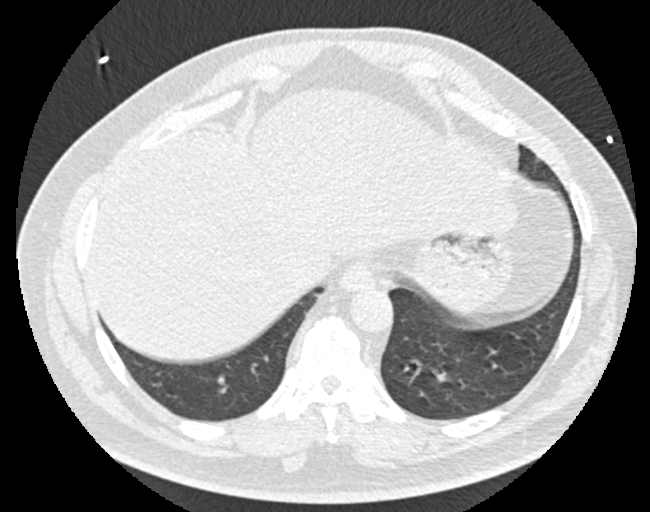
[im 20/60  vessel]
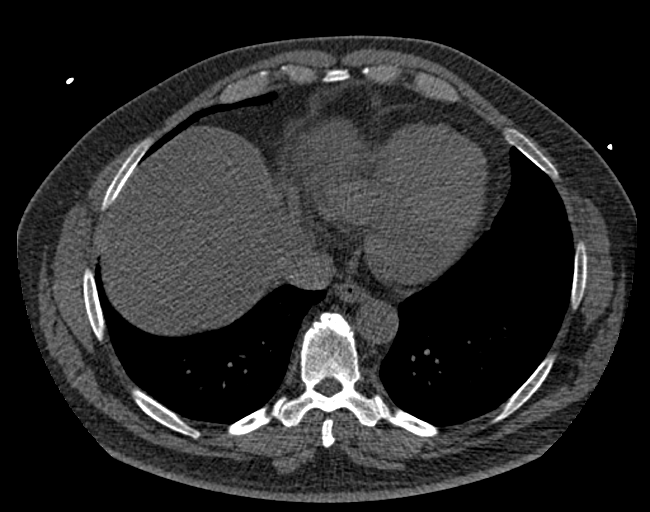
[im 30/60  vessel]
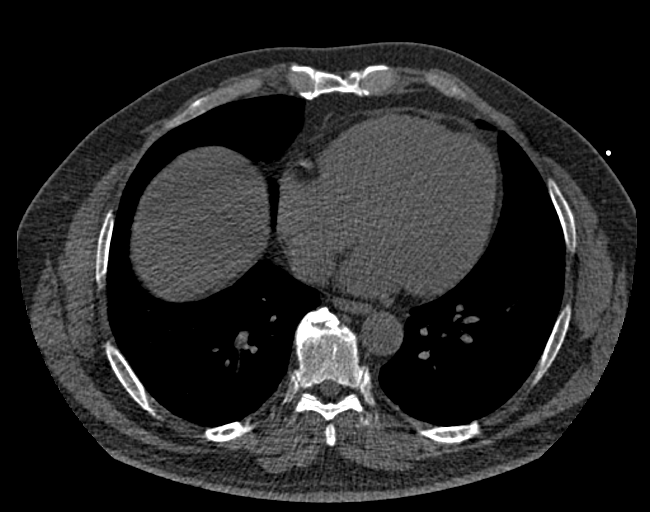
[im 40/60  vessel]
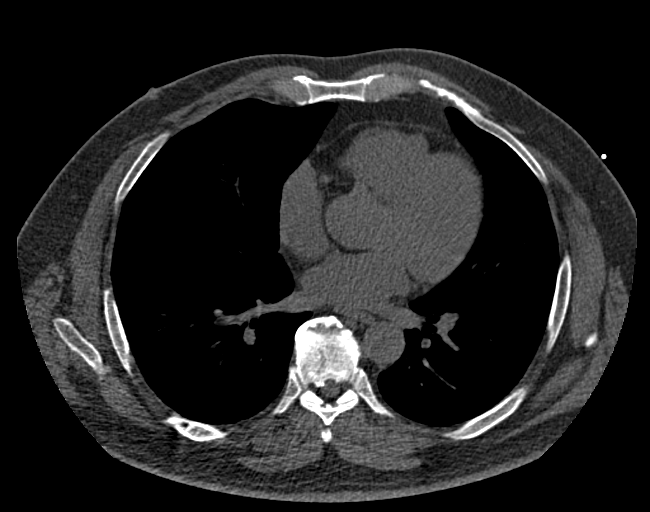
[im 50/60  vessel]
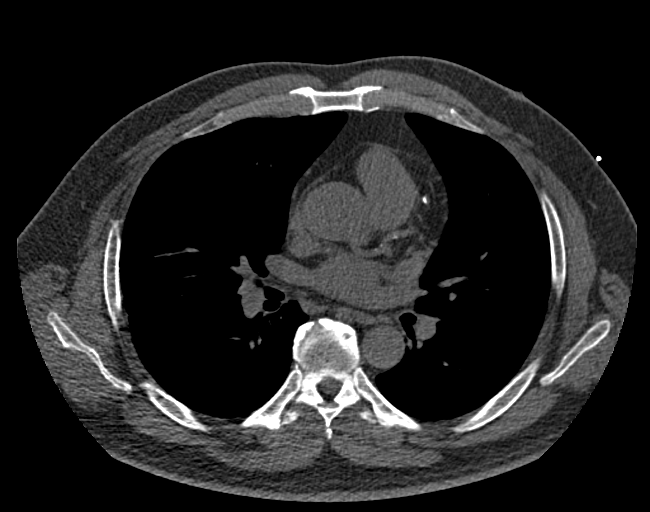
[im 50/60  lung]
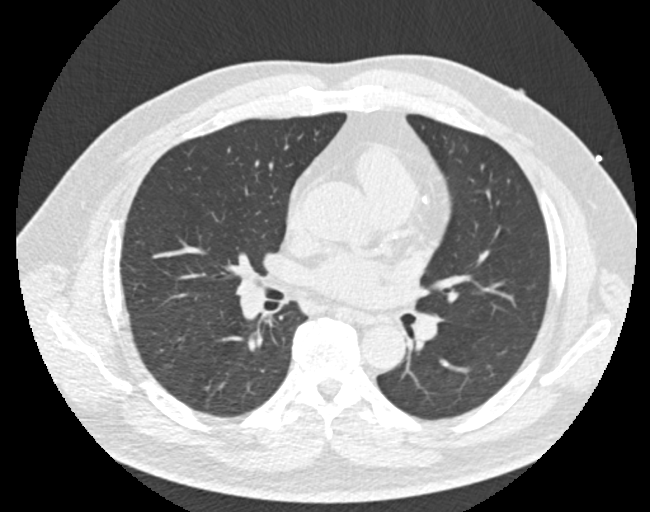

[Series 9: calcium scoring 2.00 br60 bestdiast 61% lungs · axial · 0.63mm/px · z∈[+1658,+1738]mm · 5 of 60 slices shown]
[im 10/60  vessel]
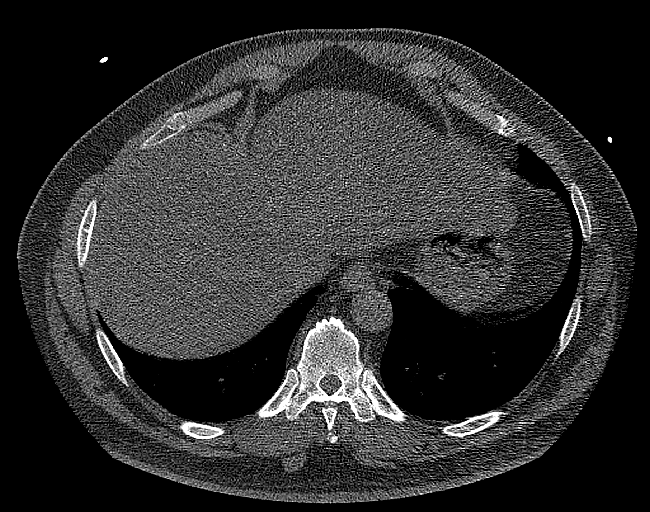
[im 20/60  vessel]
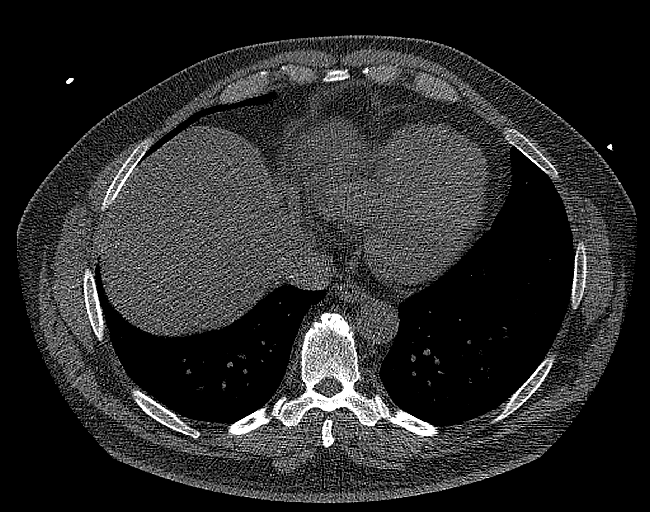
[im 30/60  vessel]
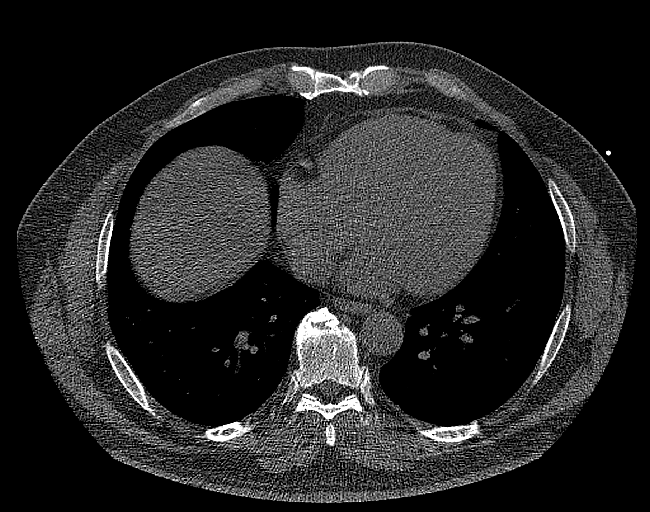
[im 40/60  vessel]
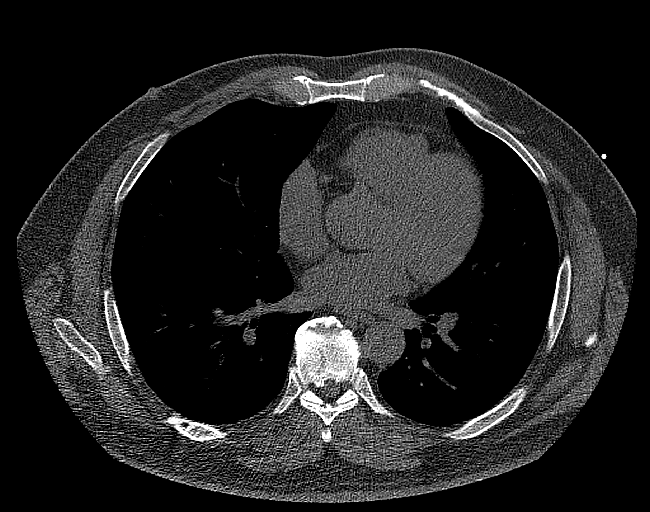
[im 50/60  vessel]
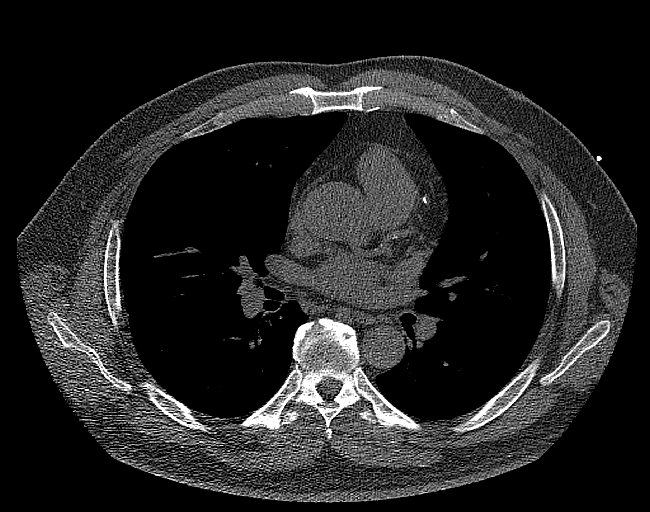

[14 of 20 positions shown; findings below may reference images not displayed]

FINDINGS: CORONARY CALCIUM SCORES:

Left Main: 0

LAD: 182

LCx:

RCA:

Total Agatston Score: 222

[HOSPITAL] percentile: 93

AORTA MEASUREMENTS:

Ascending Aorta: 38 mm

Descending Aorta: 26 mm

OTHER FINDINGS:

The heart size is within normal limits. No pericardial fluid is
identified. Visualized segments of the thoracic aorta and central
pulmonary arteries are normal in caliber. Visualized mediastinum and
hilar regions demonstrate no lymphadenopathy or masses. Visualized
lungs show no evidence of pulmonary edema, consolidation,
pneumothorax, nodule or pleural fluid. The visualized liver
demonstrates steatosis. Visualized bony structures are unremarkable.
IMPRESSION: 1. Coronary calcium score of 222 is at the 93rd percentile for the
patient's age, sex and race.
2. Hepatic steatosis.

## 2022-09-19 DIAGNOSIS — Z125 Encounter for screening for malignant neoplasm of prostate: Secondary | ICD-10-CM | POA: Diagnosis not present

## 2022-09-19 DIAGNOSIS — Z0000 Encounter for general adult medical examination without abnormal findings: Secondary | ICD-10-CM | POA: Diagnosis not present

## 2022-09-19 DIAGNOSIS — R739 Hyperglycemia, unspecified: Secondary | ICD-10-CM | POA: Diagnosis not present

## 2022-09-19 DIAGNOSIS — E7800 Pure hypercholesterolemia, unspecified: Secondary | ICD-10-CM | POA: Diagnosis not present

## 2022-09-24 NOTE — Progress Notes (Signed)
Called patient, NA, mailbox is full, cannot leave any messages at this time.

## 2022-09-24 NOTE — Progress Notes (Signed)
External Labs: Collected: 08/15/2022 AST 38, ALT 67, alkaline phosphatase 49. BUN 23, creatinine 1.69 mg/dL. Sodium 142, potassium 4.6, chloride 105, bicarb 25. Hemoglobin 14.2 g/dL, hematocrit 41% Total cholesterol 106, triglycerides 250, HDL 29, LDL 27, non-HDL 77  Please inform the patient that outside labs have been reviewed. Triglycerides are not well controlled, please discussed management with PCP. Last seen in the office in September 2022 recommended 1 year follow-up -please have it arranged.  Bronislaus Verdell Reedy, DO, [facility]

## 2022-09-26 NOTE — Progress Notes (Signed)
Tried calling patient no answer unable to leave vm

## 2022-10-18 ENCOUNTER — Other Ambulatory Visit: Payer: Self-pay | Admitting: Cardiology

## 2022-10-18 DIAGNOSIS — I2510 Atherosclerotic heart disease of native coronary artery without angina pectoris: Secondary | ICD-10-CM

## 2022-10-30 ENCOUNTER — Other Ambulatory Visit: Payer: Self-pay | Admitting: Cardiology

## 2022-10-30 DIAGNOSIS — I2510 Atherosclerotic heart disease of native coronary artery without angina pectoris: Secondary | ICD-10-CM

## 2022-11-07 ENCOUNTER — Other Ambulatory Visit: Payer: Self-pay | Admitting: Cardiology

## 2022-11-07 DIAGNOSIS — I2510 Atherosclerotic heart disease of native coronary artery without angina pectoris: Secondary | ICD-10-CM

## 2023-02-21 DIAGNOSIS — E119 Type 2 diabetes mellitus without complications: Secondary | ICD-10-CM | POA: Diagnosis not present

## 2023-02-28 DIAGNOSIS — Z8639 Personal history of other endocrine, nutritional and metabolic disease: Secondary | ICD-10-CM | POA: Diagnosis not present

## 2023-02-28 DIAGNOSIS — I10 Essential (primary) hypertension: Secondary | ICD-10-CM | POA: Diagnosis not present

## 2023-02-28 DIAGNOSIS — E1169 Type 2 diabetes mellitus with other specified complication: Secondary | ICD-10-CM | POA: Diagnosis not present

## 2023-02-28 DIAGNOSIS — E7800 Pure hypercholesterolemia, unspecified: Secondary | ICD-10-CM | POA: Diagnosis not present

## 2023-05-30 DIAGNOSIS — I10 Essential (primary) hypertension: Secondary | ICD-10-CM | POA: Diagnosis not present

## 2023-05-30 DIAGNOSIS — E1169 Type 2 diabetes mellitus with other specified complication: Secondary | ICD-10-CM | POA: Diagnosis not present

## 2023-05-30 DIAGNOSIS — E7800 Pure hypercholesterolemia, unspecified: Secondary | ICD-10-CM | POA: Diagnosis not present

## 2023-06-04 DIAGNOSIS — E1169 Type 2 diabetes mellitus with other specified complication: Secondary | ICD-10-CM | POA: Diagnosis not present

## 2023-06-04 DIAGNOSIS — I10 Essential (primary) hypertension: Secondary | ICD-10-CM | POA: Diagnosis not present

## 2023-06-04 DIAGNOSIS — E7800 Pure hypercholesterolemia, unspecified: Secondary | ICD-10-CM | POA: Diagnosis not present

## 2023-06-04 DIAGNOSIS — I2510 Atherosclerotic heart disease of native coronary artery without angina pectoris: Secondary | ICD-10-CM | POA: Diagnosis not present

## 2023-08-29 DIAGNOSIS — Z125 Encounter for screening for malignant neoplasm of prostate: Secondary | ICD-10-CM | POA: Diagnosis not present

## 2023-08-29 DIAGNOSIS — E119 Type 2 diabetes mellitus without complications: Secondary | ICD-10-CM | POA: Diagnosis not present

## 2023-08-29 DIAGNOSIS — R5383 Other fatigue: Secondary | ICD-10-CM | POA: Diagnosis not present

## 2023-09-01 LAB — LAB REPORT - SCANNED
A1c: 6
EGFR: 67

## 2023-09-03 DIAGNOSIS — E7800 Pure hypercholesterolemia, unspecified: Secondary | ICD-10-CM | POA: Diagnosis not present

## 2023-09-03 DIAGNOSIS — I2510 Atherosclerotic heart disease of native coronary artery without angina pectoris: Secondary | ICD-10-CM | POA: Diagnosis not present

## 2023-09-03 DIAGNOSIS — Z0000 Encounter for general adult medical examination without abnormal findings: Secondary | ICD-10-CM | POA: Diagnosis not present

## 2023-09-03 DIAGNOSIS — I10 Essential (primary) hypertension: Secondary | ICD-10-CM | POA: Diagnosis not present

## 2023-09-09 NOTE — Progress Notes (Signed)
External Labs: Collected: 08/29/2023 provided by PCP. A1c 6.0. Total cholesterol 116, triglycerides 78, HDL 40, LDL calculated 60  Collected: 05/30/2023: BUN 19, creatinine 1.28. Sodium 142, potassium 4.8, chloride 104, bicarb 22 AST 29, ALT 36, alkaline phosphatase 56  Outside labs reviewed.  Last office visit 2022.  Schedule 1 year follow-up visit with myself on a nonurgent basis.  Corran Lalone [place], DO, [facility]

## 2023-09-25 ENCOUNTER — Encounter: Payer: Self-pay | Admitting: Cardiology

## 2023-12-17 DIAGNOSIS — E7800 Pure hypercholesterolemia, unspecified: Secondary | ICD-10-CM | POA: Diagnosis not present

## 2023-12-17 DIAGNOSIS — E1169 Type 2 diabetes mellitus with other specified complication: Secondary | ICD-10-CM | POA: Diagnosis not present

## 2023-12-17 DIAGNOSIS — I10 Essential (primary) hypertension: Secondary | ICD-10-CM | POA: Diagnosis not present

## 2024-05-21 DIAGNOSIS — E1169 Type 2 diabetes mellitus with other specified complication: Secondary | ICD-10-CM | POA: Diagnosis not present

## 2024-05-21 DIAGNOSIS — E7800 Pure hypercholesterolemia, unspecified: Secondary | ICD-10-CM | POA: Diagnosis not present

## 2024-05-21 DIAGNOSIS — I10 Essential (primary) hypertension: Secondary | ICD-10-CM | POA: Diagnosis not present

## 2024-05-21 DIAGNOSIS — I2510 Atherosclerotic heart disease of native coronary artery without angina pectoris: Secondary | ICD-10-CM | POA: Diagnosis not present

## 2024-09-02 DIAGNOSIS — Z0000 Encounter for general adult medical examination without abnormal findings: Secondary | ICD-10-CM | POA: Diagnosis not present

## 2024-09-02 DIAGNOSIS — E7800 Pure hypercholesterolemia, unspecified: Secondary | ICD-10-CM | POA: Diagnosis not present

## 2024-09-02 DIAGNOSIS — R739 Hyperglycemia, unspecified: Secondary | ICD-10-CM | POA: Diagnosis not present

## 2024-09-02 LAB — LAB REPORT - SCANNED
A1c: 6
EGFR: 68

## 2024-09-09 DIAGNOSIS — E1169 Type 2 diabetes mellitus with other specified complication: Secondary | ICD-10-CM | POA: Diagnosis not present

## 2024-09-09 DIAGNOSIS — Z0000 Encounter for general adult medical examination without abnormal findings: Secondary | ICD-10-CM | POA: Diagnosis not present

## 2024-09-09 DIAGNOSIS — N1831 Chronic kidney disease, stage 3a: Secondary | ICD-10-CM | POA: Diagnosis not present

## 2024-09-09 DIAGNOSIS — I2510 Atherosclerotic heart disease of native coronary artery without angina pectoris: Secondary | ICD-10-CM | POA: Diagnosis not present

## 2024-09-17 ENCOUNTER — Ambulatory Visit: Payer: Self-pay | Admitting: Cardiology

## 2024-11-26 DIAGNOSIS — E1169 Type 2 diabetes mellitus with other specified complication: Secondary | ICD-10-CM | POA: Diagnosis not present

## 2024-11-26 DIAGNOSIS — I2510 Atherosclerotic heart disease of native coronary artery without angina pectoris: Secondary | ICD-10-CM | POA: Diagnosis not present

## 2024-11-26 DIAGNOSIS — I10 Essential (primary) hypertension: Secondary | ICD-10-CM | POA: Diagnosis not present

## 2024-11-26 DIAGNOSIS — E7800 Pure hypercholesterolemia, unspecified: Secondary | ICD-10-CM | POA: Diagnosis not present
